# Patient Record
Sex: Female | Born: 1945 | Hispanic: No | Smoking: Never smoker
Health system: Southern US, Community
[De-identification: ages and names within clinical notes are randomized; demographics above are authoritative.]

## PROBLEM LIST (undated history)

## (undated) DIAGNOSIS — E119 Type 2 diabetes mellitus without complications: Secondary | ICD-10-CM

## (undated) DIAGNOSIS — Z9109 Other allergy status, other than to drugs and biological substances: Secondary | ICD-10-CM

## (undated) DIAGNOSIS — I10 Essential (primary) hypertension: Secondary | ICD-10-CM

## (undated) DIAGNOSIS — R51 Headache: Secondary | ICD-10-CM

## (undated) HISTORY — DX: Type 2 diabetes mellitus without complications: E11.9

## (undated) HISTORY — PX: HERNIA REPAIR: SHX51

---

## 2010-06-18 ENCOUNTER — Emergency Department (HOSPITAL_COMMUNITY)
Admission: EM | Admit: 2010-06-18 | Discharge: 2010-06-18 | Payer: Self-pay | Source: Home / Self Care | Admitting: Family Medicine

## 2010-06-23 LAB — POCT URINALYSIS DIPSTICK
Ketones, ur: NEGATIVE mg/dL
Protein, ur: NEGATIVE mg/dL
Urobilinogen, UA: 0.2 mg/dL (ref 0.0–1.0)
pH: 5 (ref 5.0–8.0)

## 2010-06-23 LAB — HEMOGLOBIN A1C: Hgb A1c MFr Bld: 14.4 % — ABNORMAL HIGH (ref <5.7)

## 2010-06-23 LAB — POCT I-STAT, CHEM 8
BUN: 20 mg/dL (ref 6–23)
Calcium, Ion: 1.14 mmol/L (ref 1.12–1.32)
Creatinine, Ser: 0.8 mg/dL (ref 0.4–1.2)
Hemoglobin: 14.6 g/dL (ref 12.0–15.0)
TCO2: 26 mmol/L (ref 0–100)

## 2010-06-23 LAB — URINE CULTURE
Colony Count: 45000
Culture  Setup Time: 201201182041

## 2013-08-08 ENCOUNTER — Encounter (INDEPENDENT_AMBULATORY_CARE_PROVIDER_SITE_OTHER): Payer: No Typology Code available for payment source | Admitting: Cardiovascular Disease

## 2013-08-08 ENCOUNTER — Encounter: Payer: Self-pay | Admitting: Cardiovascular Disease

## 2013-08-08 NOTE — Progress Notes (Signed)
This encounter was created in error - please disregard.

## 2013-08-08 NOTE — Progress Notes (Signed)
.  Encounter is an error

## 2013-08-10 ENCOUNTER — Ambulatory Visit: Payer: Self-pay | Admitting: Cardiovascular Disease

## 2013-08-21 ENCOUNTER — Encounter: Payer: Self-pay | Admitting: Vascular Surgery

## 2013-08-22 ENCOUNTER — Ambulatory Visit (INDEPENDENT_AMBULATORY_CARE_PROVIDER_SITE_OTHER): Payer: No Typology Code available for payment source | Admitting: Vascular Surgery

## 2013-08-22 ENCOUNTER — Encounter: Payer: Self-pay | Admitting: Vascular Surgery

## 2013-08-22 VITALS — BP 137/83 | HR 68 | Resp 16 | Ht 60.0 in | Wt 165.0 lb

## 2013-08-22 DIAGNOSIS — R519 Headache, unspecified: Secondary | ICD-10-CM | POA: Insufficient documentation

## 2013-08-22 DIAGNOSIS — R51 Headache: Secondary | ICD-10-CM

## 2013-08-22 NOTE — Progress Notes (Signed)
Subjective:     Patient ID: Katherine Orr, female   DOB: 12/20/1945, 68 y.o.   MRN: [id number]  HPI this 68 year old female from [place] has lived in the [place]. She is accompanied by her son who speaks [place]. She speaks no [place]. She has been evaluated by Dr. Tenny Crawoss for left temporal headaches. She denies any visual loss but has been diagnosed with glaucoma. She recently had a workup which included sedimentation rate was elevated. The value was 40 with normal being 0-20. He continues to have the left temporal headaches radiating up into the frontal region on the left side only. She denies any other symptoms. She has no previous history of these headaches. The headaches have been present for 3-4 weeks. She also had an ear infection on the left which was treated with antibiotics.  Past Medical History  Diagnosis Date  . Diabetes mellitus without complication     History  Substance Use Topics  . Smoking status: Never Smoker   . Smokeless tobacco: Not on file  . Alcohol Use: No    History reviewed. No pertinent family history.  No Known Allergies  Current outpatient prescriptions:cetirizine (ZYRTEC) 10 MG tablet, Take 10 mg by mouth daily., Disp: , Rfl: ;  fluticasone (VERAMYST) 27.5 MCG/SPRAY nasal spray, Place 1 spray into the nose daily., Disp: , Rfl: ;  glipiZIDE (GLUCOTROL) 5 MG tablet, Take 5 mg by mouth 2 (two) times daily before a meal., Disp: , Rfl: ;  losartan (COZAAR) 50 MG tablet, Take 50 mg by mouth daily., Disp: , Rfl:  metFORMIN (GLUCOPHAGE) 500 MG tablet, Take 500 mg by mouth 2 (two) times daily with a meal., Disp: , Rfl:   BP 137/83  Pulse 68  Resp 16  Ht 5' (1.524 m)  Wt 165 lb (74.844 kg)  BMI 32.22 kg/m2  Body mass index is 32.22 kg/(m^2).          Review of Systems denies chest pain, dyspnea on exertion, PND, orthopnea, hemoptysis    Objective:  vs Physical Exam BP 137/83  Pulse 68  Resp 16  Ht 5' (1.524 m)  Wt 165 lb (74.844 kg)  BMI  32.22 kg/m2  General well-developed well-nourished female no apparent stress alert and oriented x3 HEENT-normal for age Lungs no rhonchi or wheezing Cardiovascular regular and no murmurs Abdomen soft nontender no masses palpable Left temporal region is mildly tender to palpation. There is a palpable pulse in the left superficial temporal artery.      Assessment:     New onset left temporal and frontal headaches with elevated sed rate-Dr. Tenny Crawoss has requested temporal artery biopsy    Plan:     Have scheduled left temporal artery biopsy for Dr. Darrick Pennafields on Monday, March 30 under local anesthesia. Son will be present as an interpreter since patient does not speak [place]

## 2013-08-24 ENCOUNTER — Other Ambulatory Visit: Payer: Self-pay

## 2013-08-25 ENCOUNTER — Encounter (HOSPITAL_COMMUNITY): Payer: Self-pay

## 2013-08-25 ENCOUNTER — Ambulatory Visit (HOSPITAL_COMMUNITY)
Admission: RE | Admit: 2013-08-25 | Discharge: 2013-08-25 | Disposition: A | Discharge status: Home / Self Care | Payer: No Typology Code available for payment source | Source: Ambulatory Visit | Attending: Anesthesiology | Admitting: Anesthesiology

## 2013-08-25 ENCOUNTER — Encounter (HOSPITAL_COMMUNITY)
Admission: RE | Admit: 2013-08-25 | Discharge: 2013-08-25 | Disposition: A | Discharge status: Home / Self Care | Payer: No Typology Code available for payment source | Source: Ambulatory Visit | Attending: Vascular Surgery | Admitting: Vascular Surgery

## 2013-08-25 DIAGNOSIS — Z01812 Encounter for preprocedural laboratory examination: Secondary | ICD-10-CM | POA: Insufficient documentation

## 2013-08-25 DIAGNOSIS — I10 Essential (primary) hypertension: Secondary | ICD-10-CM | POA: Insufficient documentation

## 2013-08-25 DIAGNOSIS — Z01811 Encounter for preprocedural respiratory examination: Secondary | ICD-10-CM | POA: Insufficient documentation

## 2013-08-25 DIAGNOSIS — Z01818 Encounter for other preprocedural examination: Secondary | ICD-10-CM | POA: Insufficient documentation

## 2013-08-25 HISTORY — DX: Other allergy status, other than to drugs and biological substances: Z91.09

## 2013-08-25 HISTORY — DX: Essential (primary) hypertension: I10

## 2013-08-25 HISTORY — DX: Headache: R51

## 2013-08-25 LAB — CBC
HEMATOCRIT: 41.2 % (ref 36.0–46.0)
HEMOGLOBIN: 14 g/dL (ref 12.0–15.0)
MCH: 29.6 pg (ref 26.0–34.0)
MCHC: 34 g/dL (ref 30.0–36.0)
MCV: 87.1 fL (ref 78.0–100.0)
Platelets: 238 10*3/uL (ref 150–400)
RBC: 4.73 MIL/uL (ref 3.87–5.11)
RDW: 13.1 % (ref 11.5–15.5)
WBC: 7.5 10*3/uL (ref 4.0–10.5)

## 2013-08-25 LAB — BASIC METABOLIC PANEL
BUN: 21 mg/dL (ref 6–23)
CALCIUM: 9.9 mg/dL (ref 8.4–10.5)
CO2: 24 meq/L (ref 19–32)
CREATININE: 1.01 mg/dL (ref 0.50–1.10)
Chloride: 99 mEq/L (ref 96–112)
GFR calc Af Amer: 64 mL/min — ABNORMAL LOW (ref >90)
GFR, EST NON AFRICAN AMERICAN: 55 mL/min — AB (ref >90)
GLUCOSE: 164 mg/dL — AB (ref 70–99)
Potassium: 4.8 mEq/L (ref 3.7–5.3)
SODIUM: 138 meq/L (ref 137–147)

## 2013-08-25 NOTE — Progress Notes (Addendum)
Pt's son notified of arrival time of 07:30 verbalized understanding.

## 2013-08-27 MED ORDER — DEXTROSE 5 % IV SOLN
1.5000 g | INTRAVENOUS | Status: AC
  Administered 2013-08-28: 1.5 g via INTRAVENOUS
  Filled 2013-08-27: qty 1.5

## 2013-08-28 ENCOUNTER — Ambulatory Visit (HOSPITAL_COMMUNITY): Payer: No Typology Code available for payment source | Admitting: Certified Registered Nurse Anesthetist

## 2013-08-28 ENCOUNTER — Encounter (HOSPITAL_COMMUNITY)
Admission: RE | Disposition: A | Discharge status: Home / Self Care | Payer: Self-pay | Source: Ambulatory Visit | Attending: Vascular Surgery

## 2013-08-28 ENCOUNTER — Encounter (HOSPITAL_COMMUNITY): Payer: Self-pay | Admitting: *Deleted

## 2013-08-28 ENCOUNTER — Ambulatory Visit (HOSPITAL_COMMUNITY)
Admission: RE | Admit: 2013-08-28 | Discharge: 2013-08-28 | Disposition: A | Discharge status: Home / Self Care | Payer: No Typology Code available for payment source | Source: Ambulatory Visit | Attending: Vascular Surgery | Admitting: Vascular Surgery

## 2013-08-28 ENCOUNTER — Encounter (HOSPITAL_COMMUNITY): Payer: No Typology Code available for payment source | Admitting: Certified Registered Nurse Anesthetist

## 2013-08-28 DIAGNOSIS — E119 Type 2 diabetes mellitus without complications: Secondary | ICD-10-CM | POA: Insufficient documentation

## 2013-08-28 DIAGNOSIS — H409 Unspecified glaucoma: Secondary | ICD-10-CM | POA: Insufficient documentation

## 2013-08-28 DIAGNOSIS — R51 Headache: Secondary | ICD-10-CM | POA: Insufficient documentation

## 2013-08-28 DIAGNOSIS — R519 Headache, unspecified: Secondary | ICD-10-CM

## 2013-08-28 DIAGNOSIS — I10 Essential (primary) hypertension: Secondary | ICD-10-CM | POA: Insufficient documentation

## 2013-08-28 HISTORY — PX: ARTERY BIOPSY: SHX891

## 2013-08-28 LAB — POCT I-STAT 4, (NA,K, GLUC, HGB,HCT)
Glucose, Bld: 103 mg/dL — ABNORMAL HIGH (ref 70–99)
HEMATOCRIT: 42 % (ref 36.0–46.0)
HEMOGLOBIN: 14.3 g/dL (ref 12.0–15.0)
Potassium: 4 mEq/L (ref 3.7–5.3)
Sodium: 142 mEq/L (ref 137–147)

## 2013-08-28 LAB — GLUCOSE, CAPILLARY: Glucose-Capillary: 111 mg/dL — ABNORMAL HIGH (ref 70–99)

## 2013-08-28 SURGERY — BIOPSY TEMPORAL ARTERY
Anesthesia: Monitor Anesthesia Care | Site: Head | Laterality: Left

## 2013-08-28 MED ORDER — PROPOFOL 10 MG/ML IV BOLUS
INTRAVENOUS | Status: DC | PRN
  Administered 2013-08-28: 20 mg via INTRAVENOUS

## 2013-08-28 MED ORDER — LIDOCAINE HCL (CARDIAC) 20 MG/ML IV SOLN
INTRAVENOUS | Status: AC
  Filled 2013-08-28: qty 5

## 2013-08-28 MED ORDER — FENTANYL CITRATE 0.05 MG/ML IJ SOLN
INTRAMUSCULAR | Status: DC | PRN
  Administered 2013-08-28: 50 ug via INTRAVENOUS

## 2013-08-28 MED ORDER — HYDROMORPHONE HCL PF 1 MG/ML IJ SOLN: 0.2500 mg | INTRAMUSCULAR | Status: DC | PRN

## 2013-08-28 MED ORDER — LIDOCAINE HCL (PF) 1 % IJ SOLN
INTRAMUSCULAR | Status: AC
  Filled 2013-08-28: qty 30

## 2013-08-28 MED ORDER — CHLORHEXIDINE GLUCONATE CLOTH 2 % EX PADS: 6.0000 | MEDICATED_PAD | Freq: Once | CUTANEOUS | Status: DC

## 2013-08-28 MED ORDER — SODIUM CHLORIDE 0.9 % IV SOLN: INTRAVENOUS | Status: DC

## 2013-08-28 MED ORDER — ONDANSETRON HCL 4 MG/2ML IJ SOLN
INTRAMUSCULAR | Status: DC | PRN
  Administered 2013-08-28: 4 mg via INTRAVENOUS

## 2013-08-28 MED ORDER — PROPOFOL 10 MG/ML IV BOLUS
INTRAVENOUS | Status: AC
  Filled 2013-08-28: qty 20

## 2013-08-28 MED ORDER — LACTATED RINGERS IV SOLN: INTRAVENOUS | Status: DC

## 2013-08-28 MED ORDER — MIDAZOLAM HCL 5 MG/5ML IJ SOLN
INTRAMUSCULAR | Status: DC | PRN
  Administered 2013-08-28: 2 mg via INTRAVENOUS

## 2013-08-28 MED ORDER — ONDANSETRON HCL 4 MG/2ML IJ SOLN
INTRAMUSCULAR | Status: AC
Start: 2013-08-28 — End: 2013-08-28
  Filled 2013-08-28: qty 2

## 2013-08-28 MED ORDER — ONDANSETRON HCL 4 MG/2ML IJ SOLN
INTRAMUSCULAR | Status: AC
  Filled 2013-08-28: qty 2

## 2013-08-28 MED ORDER — FENTANYL CITRATE 0.05 MG/ML IJ SOLN
INTRAMUSCULAR | Status: AC
  Filled 2013-08-28: qty 5

## 2013-08-28 MED ORDER — MIDAZOLAM HCL 2 MG/2ML IJ SOLN
INTRAMUSCULAR | Status: AC
  Filled 2013-08-28: qty 2

## 2013-08-28 MED ORDER — LIDOCAINE HCL (PF) 1 % IJ SOLN
INTRAMUSCULAR | Status: DC | PRN
  Administered 2013-08-28: 30 mL

## 2013-08-28 MED ORDER — ROCURONIUM BROMIDE 50 MG/5ML IV SOLN
INTRAVENOUS | Status: AC
  Filled 2013-08-28: qty 1

## 2013-08-28 MED ORDER — PHENYLEPHRINE HCL 10 MG/ML IJ SOLN
INTRAMUSCULAR | Status: DC | PRN
  Administered 2013-08-28 (×2): 120 ug via INTRAVENOUS

## 2013-08-28 MED ORDER — LACTATED RINGERS IV SOLN
INTRAVENOUS | Status: DC | PRN
  Administered 2013-08-28: 08:00:00 via INTRAVENOUS

## 2013-08-28 SURGICAL SUPPLY — 38 items
CANISTER SUCTION 2500CC (MISCELLANEOUS) ×2 IMPLANT
CLIP TI WIDE RED SMALL 6 (CLIP) ×2 IMPLANT
CONT SPEC 4OZ CLIKSEAL STRL BL (MISCELLANEOUS) ×2 IMPLANT
COTTONBALL LRG STERILE PKG (GAUZE/BANDAGES/DRESSINGS) ×2 IMPLANT
COVER SURGICAL LIGHT HANDLE (MISCELLANEOUS) ×2 IMPLANT
DECANTER SPIKE VIAL GLASS SM (MISCELLANEOUS) ×2 IMPLANT
DERMABOND ADHESIVE PROPEN (GAUZE/BANDAGES/DRESSINGS) ×1
DERMABOND ADVANCED (GAUZE/BANDAGES/DRESSINGS) ×1
DERMABOND ADVANCED .7 DNX12 (GAUZE/BANDAGES/DRESSINGS) ×1 IMPLANT
DERMABOND ADVANCED .7 DNX6 (GAUZE/BANDAGES/DRESSINGS) ×1 IMPLANT
DRAPE ORTHO SPLIT 77X108 STRL (DRAPES) ×1
DRAPE PROXIMA HALF (DRAPES) ×2 IMPLANT
DRAPE SURG ORHT 6 SPLT 77X108 (DRAPES) ×1 IMPLANT
ELECT REM PT RETURN 9FT ADLT (ELECTROSURGICAL) ×2
ELECTRODE REM PT RTRN 9FT ADLT (ELECTROSURGICAL) ×1 IMPLANT
GAUZE SPONGE 4X4 16PLY XRAY LF (GAUZE/BANDAGES/DRESSINGS) ×2 IMPLANT
GLOVE BIO SURGEON STRL SZ7.5 (GLOVE) ×2 IMPLANT
GLOVE BIOGEL PI IND STRL 6.5 (GLOVE) ×2 IMPLANT
GLOVE BIOGEL PI INDICATOR 6.5 (GLOVE) ×2
GLOVE ECLIPSE 6.5 STRL STRAW (GLOVE) ×2 IMPLANT
GOWN STRL REUS W/ TWL LRG LVL3 (GOWN DISPOSABLE) ×3 IMPLANT
GOWN STRL REUS W/TWL LRG LVL3 (GOWN DISPOSABLE) ×3
KIT BASIN OR (CUSTOM PROCEDURE TRAY) ×2 IMPLANT
KIT ROOM TURNOVER OR (KITS) ×2 IMPLANT
NEEDLE HYPO 25GX1X1/2 BEV (NEEDLE) ×2 IMPLANT
NS IRRIG 1000ML POUR BTL (IV SOLUTION) ×2 IMPLANT
PACK GENERAL/GYN (CUSTOM PROCEDURE TRAY) ×2 IMPLANT
PAD ARMBOARD 7.5X6 YLW CONV (MISCELLANEOUS) ×4 IMPLANT
SUCTION FRAZIER TIP 10 FR DISP (SUCTIONS) IMPLANT
SUT PROLENE 6 0 CC (SUTURE) IMPLANT
SUT SILK 3 0 (SUTURE) ×1
SUT SILK 3-0 18XBRD TIE 12 (SUTURE) ×1 IMPLANT
SUT VIC AB 3-0 SH 27 (SUTURE) ×1
SUT VIC AB 3-0 SH 27XBRD (SUTURE) ×1 IMPLANT
SYR CONTROL 10ML LL (SYRINGE) ×2 IMPLANT
TOWEL OR 17X24 6PK STRL BLUE (TOWEL DISPOSABLE) ×2 IMPLANT
TOWEL OR 17X26 10 PK STRL BLUE (TOWEL DISPOSABLE) ×2 IMPLANT
WATER STERILE IRR 1000ML POUR (IV SOLUTION) ×2 IMPLANT

## 2013-08-28 NOTE — Op Note (Signed)
Procedure: Left temporal artery biopsy  Preoperative diagnosis: Headaches  Postoperative diagnosis: Same  Anesthesia: Local with IV sedation  Specimens: Left temporal artery  Operative details: After obtaining informed consent, the patient was taken to the operating room. The patient was placed in supine position on the operating room table. Next Doppler was used to map out the course of the left superficial temporal artery. This area was prepped and draped in usual sterile fashion. Local anesthesia was infiltrated over this area. Skin incision was made over the area of the left superficial temporal artery. The temporal artery was dissected free circumferentially. This was ligated proximally and distally with 3-0 silk ties. The intervening segment of approximately 3 cm was excised completely and sent to pathology as a specimen. Hemostasis was obtained. The subcutaneous tissues reapproximated using a running 3-0 Vicryl suture. The skin was closed with 4 0 Vicryl subcuticular stitch. Dermabond was applied the skin incision. The patient tolerated procedure well and there were no complications. Instrument sponge and needle counts were correct at the end of the case. The patient was taken to the recovery room in stable condition.  Mekesha Solomon, MD [facility] Office: [phone] Pager: [phone]  

## 2013-08-28 NOTE — Anesthesia Postprocedure Evaluation (Signed)
  Anesthesia Post-op Note  Patient: Katherine Orr  Procedure(s) Performed: Procedure(s): BIOPSY TEMPORAL ARTERY- LEFT (Left)  Patient Location: PACU  Anesthesia Type:Mac  Level of Consciousness: awake  Airway and Oxygen Therapy: Patient Spontanous Breathing  Post-op Pain: mild  Post-op Assessment: Post-op Vital signs reviewed  Post-op Vital Signs: Reviewed  Complications: No apparent anesthesia complications

## 2013-08-28 NOTE — Anesthesia Preprocedure Evaluation (Addendum)
Anesthesia Evaluation  Patient identified by MRN, date of birth, ID band Patient awake    Reviewed: Allergy & Precautions, H&P , NPO status , Patient's Chart, lab work & pertinent test results  History of Anesthesia Complications (+) PONV  Airway Mallampati: II TM Distance: >3 FB Neck ROM: Full    Dental  (+) Dental Advisory Given, Teeth Intact, Missing,    Pulmonary  breath sounds clear to auscultation        Cardiovascular hypertension, Pt. on medications Rhythm:Regular Rate:Normal     Neuro/Psych  Headaches,    GI/Hepatic negative GI ROS, Neg liver ROS,   Endo/Other  diabetes, Type 2, Oral Hypoglycemic Agents  Renal/GU negative Renal ROS     Musculoskeletal   Abdominal   Peds  Hematology   Anesthesia Other Findings   Reproductive/Obstetrics                      Anesthesia Physical Anesthesia Plan  ASA: III  Anesthesia Plan: MAC   Post-op Pain Management:    Induction: Intravenous  Airway Management Planned: Natural Airway and Simple Face Mask  Additional Equipment:   Intra-op Plan:   Post-operative Plan:   Informed Consent: I have reviewed the patients History and Physical, chart, labs and discussed the procedure including the risks, benefits and alternatives for the proposed anesthesia with the patient or authorized representative who has indicated his/her understanding and acceptance.   Dental advisory given  Plan Discussed with: CRNA, Anesthesiologist and Surgeon  Anesthesia Plan Comments:        Anesthesia Quick Evaluation

## 2013-08-28 NOTE — H&P (View-Only) (Signed)
Subjective:     Patient ID: Katherine Orr, female   DOB: 03/16/1946, 68 y.o.   MRN: [id number]  HPI this 68-year-old female from [place] has lived in the [place] for [place]. She is accompanied by her son who speaks English. She speaks no English. She has been evaluated by Dr. Ross for left temporal headaches. She denies any visual loss but has been diagnosed with glaucoma. She recently had a workup which included sedimentation rate was elevated. The value was 40 with normal being 0-20. He continues to have the left temporal headaches radiating up into the frontal region on the left side only. She denies any other symptoms. She has no previous history of these headaches. The headaches have been present for 3-4 weeks. She also had an ear infection on the left which was treated with antibiotics.  Past Medical History  Diagnosis Date  . Diabetes mellitus without complication     History  Substance Use Topics  . Smoking status: Never Smoker   . Smokeless tobacco: Not on file  . Alcohol Use: No    History reviewed. No pertinent family history.  No Known Allergies  Current outpatient prescriptions:cetirizine (ZYRTEC) 10 MG tablet, Take 10 mg by mouth daily., Disp: , Rfl: ;  fluticasone (VERAMYST) 27.5 MCG/SPRAY nasal spray, Place 1 spray into the nose daily., Disp: , Rfl: ;  glipiZIDE (GLUCOTROL) 5 MG tablet, Take 5 mg by mouth 2 (two) times daily before a meal., Disp: , Rfl: ;  losartan (COZAAR) 50 MG tablet, Take 50 mg by mouth daily., Disp: , Rfl:  metFORMIN (GLUCOPHAGE) 500 MG tablet, Take 500 mg by mouth 2 (two) times daily with a meal., Disp: , Rfl:   BP 137/83  Pulse 68  Resp 16  Ht 5' (1.524 m)  Wt 165 lb (74.844 kg)  BMI 32.22 kg/m2  Body mass index is 32.22 kg/(m^2).          Review of Systems denies chest pain, dyspnea on exertion, PND, orthopnea, hemoptysis    Objective:  vs Physical Exam BP 137/83  Pulse 68  Resp 16  Ht 5' (1.524 m)  Wt 165 lb (74.844 kg)  BMI  32.22 kg/m2  General well-developed well-nourished female no apparent stress alert and oriented x3 HEENT-normal for age Lungs no rhonchi or wheezing Cardiovascular regular and no murmurs Abdomen soft nontender no masses palpable Left temporal region is mildly tender to palpation. There is a palpable pulse in the left superficial temporal artery.      Assessment:     New onset left temporal and frontal headaches with elevated sed rate-Dr. Ross has requested temporal artery biopsy    Plan:     Have scheduled left temporal artery biopsy for Dr. fields on Monday, March 30 under local anesthesia. Son will be present as an interpreter since patient does not speak English      

## 2013-08-28 NOTE — Transfer of Care (Signed)
Immediate Anesthesia Transfer of Care Note  Patient: Katherine Orr  Procedure(s) Performed: Procedure(s): BIOPSY TEMPORAL ARTERY- LEFT (Left)  Patient Location: PACU  Anesthesia Type:MAC  Level of Consciousness: awake and alert   Airway & Oxygen Therapy: Patient Spontanous Breathing and Patient connected to nasal cannula oxygen  Post-op Assessment: Report given to PACU RN, Post -op Vital signs reviewed and stable and Patient moving all extremities X 4  Post vital signs: Reviewed and stable  Complications: No apparent anesthesia complications

## 2013-08-28 NOTE — Interval H&P Note (Signed)
History and Physical Interval Note:  08/28/2013 9:05 AM  Katherine Orr  has presented today for surgery, with the diagnosis of Left Temporal Headaches  The various methods of treatment have been discussed with the patient and family. After consideration of risks, benefits and other options for treatment, the patient has consented to  Procedure(s): BIOPSY TEMPORAL ARTERY- LEFT (Left) as a surgical intervention .  The patient's history has been reviewed, patient examined, no change in status, stable for surgery.  I have reviewed the patient's chart and labs.  Questions were answered to the patient's satisfaction.     [name] E

## 2013-08-28 NOTE — Anesthesia Procedure Notes (Signed)
Procedure Name: MAC Date/Time: 08/28/2013 9:29 AM Performed by: Elberta LeatherwoodURNER, Kwane Rohl E Pre-anesthesia Checklist: Patient identified, Emergency Drugs available, Suction available and Patient being monitored Patient Re-evaluated:Patient Re-evaluated prior to inductionOxygen Delivery Method: Simple face mask Preoxygenation: Pre-oxygenation with 100% oxygen Placement Confirmation: positive ETCO2 and breath sounds checked- equal and bilateral Dental Injury: Teeth and Oropharynx as per pre-operative assessment

## 2013-08-28 NOTE — Discharge Instructions (Signed)

## 2013-08-28 NOTE — Preoperative (Signed)
Beta Blockers   Reason not to administer Beta Blockers:Not Applicable 

## 2013-08-29 ENCOUNTER — Encounter (HOSPITAL_COMMUNITY): Payer: Self-pay | Admitting: Vascular Surgery

## 2015-09-02 ENCOUNTER — Emergency Department (HOSPITAL_COMMUNITY)
Admission: EM | Admit: 2015-09-02 | Discharge: 2015-09-02 | Disposition: A | Discharge status: Home / Self Care | Payer: Medicaid Other | Attending: Emergency Medicine | Admitting: Emergency Medicine

## 2015-09-02 ENCOUNTER — Encounter (HOSPITAL_COMMUNITY): Payer: Self-pay | Admitting: Nurse Practitioner

## 2015-09-02 ENCOUNTER — Emergency Department (HOSPITAL_BASED_OUTPATIENT_CLINIC_OR_DEPARTMENT_OTHER)
Admit: 2015-09-02 | Discharge: 2015-09-02 | Disposition: A | Discharge status: Home / Self Care | Payer: Medicaid Other | Attending: Emergency Medicine | Admitting: Emergency Medicine

## 2015-09-02 DIAGNOSIS — Z7984 Long term (current) use of oral hypoglycemic drugs: Secondary | ICD-10-CM | POA: Insufficient documentation

## 2015-09-02 DIAGNOSIS — E119 Type 2 diabetes mellitus without complications: Secondary | ICD-10-CM | POA: Insufficient documentation

## 2015-09-02 DIAGNOSIS — M79609 Pain in unspecified limb: Secondary | ICD-10-CM

## 2015-09-02 DIAGNOSIS — I10 Essential (primary) hypertension: Secondary | ICD-10-CM | POA: Diagnosis not present

## 2015-09-02 DIAGNOSIS — Z79899 Other long term (current) drug therapy: Secondary | ICD-10-CM | POA: Insufficient documentation

## 2015-09-02 DIAGNOSIS — M7989 Other specified soft tissue disorders: Secondary | ICD-10-CM

## 2015-09-02 DIAGNOSIS — I82402 Acute embolism and thrombosis of unspecified deep veins of left lower extremity: Secondary | ICD-10-CM

## 2015-09-02 LAB — CBG MONITORING, ED: Glucose-Capillary: 115 mg/dL — ABNORMAL HIGH (ref 65–99)

## 2015-09-02 MED ORDER — XARELTO VTE STARTER PACK 15 & 20 MG PO TBPK: 15.0000 mg | ORAL_TABLET | ORAL | Status: AC

## 2015-09-02 MED ORDER — RIVAROXABAN 15 MG PO TABS
15.0000 mg | ORAL_TABLET | Freq: Once | ORAL | Status: AC
  Administered 2015-09-02: 15 mg via ORAL
  Filled 2015-09-02: qty 1

## 2015-09-02 MED ORDER — RIVAROXABAN (XARELTO) EDUCATION KIT FOR DVT/PE PATIENTS: 1.0000 | PACK | Freq: Once | Status: AC

## 2015-09-02 NOTE — Discharge Instructions (Signed)
Start xarelto started pack as prescribed.   See your doctor.   Repeat leg ultrasound in a month to ensure resolution.   Avoid long car rides or plane rides.   Return to ER if you have worse leg swelling, leg pain, leg numbness, chest pain, shortness of breath.   Information on my medicine - XARELTO (rivaroxaban)  This medication education was reviewed with me or my healthcare representative as part of my discharge preparation.  The pharmacist that spoke with me during my hospital stay was:  Tel Hevia, Drake Leachachel Lynn, [facility]  WHY WAS Carlena HurlXARELTO PRESCRIBED FOR YOU? Xarelto was prescribed to treat blood clots that may have been found in the veins of your legs (deep vein thrombosis) or in your lungs (pulmonary embolism) and to reduce the risk of them occurring again.  What do you need to know about Xarelto? The starting dose is one 15 mg tablet taken TWICE daily with food for the FIRST 21 DAYS then on 09/25/15  the dose is changed to one 20 mg tablet taken ONCE A DAY with your evening meal.  DO NOT stop taking Xarelto without talking to the health care provider who prescribed the medication.  Refill your prescription for 20 mg tablets before you run out.  After discharge, you should have regular check-up appointments with your healthcare provider that is prescribing your Xarelto.  In the future your dose may need to be changed if your kidney function changes by a significant amount.  What do you do if you miss a dose? If you are taking Xarelto TWICE DAILY and you miss a dose, take it as soon as you remember. You may take two 15 mg tablets (total 30 mg) at the same time then resume your regularly scheduled 15 mg twice daily the next day.  If you are taking Xarelto ONCE DAILY and you miss a dose, take it as soon as you remember on the same day then continue your regularly scheduled once daily regimen the next day. Do not take two doses of Xarelto at the same time.   Important Safety  Information Xarelto is a blood thinner medicine that can cause bleeding. You should call your healthcare provider right away if you experience any of the following: ? Bleeding from an injury or your nose that does not stop. ? Unusual colored urine (red or dark brown) or unusual colored stools (red or black). ? Unusual bruising for unknown reasons. ? A serious fall or if you hit your head (even if there is no bleeding).  Some medicines may interact with Xarelto and might increase your risk of bleeding while on Xarelto. To help avoid this, consult your healthcare provider or pharmacist prior to using any new prescription or non-prescription medications, including herbals, vitamins, non-steroidal anti-inflammatory drugs (NSAIDs) and supplements.  This website has more information on Xarelto: [email or web address].xarelto.[email or web address].

## 2015-09-02 NOTE — ED Provider Notes (Addendum)
CSN: [id number]     Arrival date & time 09/02/15  1516 History   First MD Initiated Contact with Patient 09/02/15 1710     Chief Complaint  Patient presents with  . Leg Swelling     (Consider location/radiation/quality/duration/timing/severity/associated sxs/prior Treatment) The history is provided by the patient and a relative. A language interpreter was used.  ELIZA GREEN is a 70 y.o. female hx of DM, HTN here with left leg swelling. Patient is from [place] and went back to [place] for the last 6 month and came back about a month ago after a long plane ride. Patient developed gradually worsening left lower leg swelling for the last month or so. Denies any chest pain or shortness of breath or abdominal pain. Went to see her doctor today and had labs drawn that showed normal creatinine and normal CBC. She had a d-dimer that was 6 and sent for vascular ultrasound which showed L common femoral, popliteal posterior tibial DVT and was subsequently sent to the ED. No hx of DVT prior to this. Of note, triage note mentioned possible stroke but patient denies numbness or weakness and the transfer paperwork from PCP didn't mention anything about stroke so likely documentation error.     Son interpreting   Past Medical History  Diagnosis Date  . Diabetes mellitus without complication (HCC)   . Hypertension   . Headache(784.0)   . Environmental allergies     perfumes, seasonal   Past Surgical History  Procedure Laterality Date  . Hernia repair    . Artery biopsy Left 08/28/2013    Procedure: BIOPSY TEMPORAL ARTERY- LEFT;  Surgeon: Sherren Kerns, MD;  Location: [facility] OR;  Service: Vascular;  Laterality: Left;   History reviewed. No pertinent family history. Social History  Substance Use Topics  . Smoking status: Never Smoker   . Smokeless tobacco: Never Used  . Alcohol Use: No   OB History    No data available     Review of Systems  Musculoskeletal:       L leg swelling   All other  systems reviewed and are negative.     Allergies  Onion  Home Medications   Prior to Admission medications   Medication Sig Start Date End Date Taking? Authorizing Provider  glipiZIDE (GLUCOTROL) 5 MG tablet Take 5 mg by mouth 2 (two) times daily before a meal.   Yes Historical Provider, MD  latanoprost (XALATAN) 0.005 % ophthalmic solution Place 1 drop into both eyes at bedtime.   Yes Historical Provider, MD  losartan (COZAAR) 50 MG tablet Take 50 mg by mouth daily.   Yes Historical Provider, MD  metFORMIN (GLUCOPHAGE) 500 MG tablet Take 500 mg by mouth 2 (two) times daily with a meal.   Yes Historical Provider, MD   BP 125/63 mmHg  Pulse 72  Temp(Src) 98.2 F (36.8 C) (Oral)  Resp 18  SpO2 97% Physical Exam  Constitutional: She is oriented to person, place, and time. She appears well-developed and well-nourished.  HENT:  Head: Normocephalic.  Mouth/Throat: Oropharynx is clear and moist.  Eyes: Conjunctivae are normal. Pupils are equal, round, and reactive to light.  Neck: Normal range of motion. Neck supple.  Cardiovascular: Normal rate, regular rhythm and normal heart sounds.   Pulmonary/Chest: Effort normal and breath sounds normal. No respiratory distress. She has no wheezes. She has no rales.  Abdominal: Soft. Bowel sounds are normal. She exhibits no distension. There is no tenderness. There is no rebound.  Musculoskeletal:  L leg swollen. Slightly colder but has good peripheral pulses. Able to wiggle toes. Nl sensation. Nl strength L lower extremity   Neurological: She is alert and oriented to person, place, and time.  Skin: Skin is warm and dry.  Psychiatric: She has a normal mood and affect. Her behavior is normal. Judgment and thought content normal.  Nursing note and vitals reviewed.   ED Course  Procedures (including critical care time) Labs Review Labs Reviewed  CBG MONITORING, ED    Imaging Review No results found. I have personally reviewed and  evaluated these images and lab results as part of my medical decision-making.   EKG Interpretation None      MDM   Final diagnoses:  None    Arsema A Carney Livinglataya is a 70 y.o. female here with L leg DVT. Cr 1.1 on chemistry earlier today. CBG 115 in the ED. Not tachycardic and has no shortness of breath. I doubt PE. Patient has DVT confirmed on US and the leg is slightly cold but has good pulses. Has medicaid. I consulted case management to make sure that medicaid can pay for xarelto. Pharmacy also came to educate patient and son about the medication and given 30 day free trial coupon. Given strict return precautions. Repeat DVT study in a month to ensure improvement/resolution.      Richardean Canalavid H Yao, MD 09/02/15 Paulo [id number]  Richardean Canalavid H Yao, MD 09/02/15 [phone]

## 2015-09-02 NOTE — Progress Notes (Signed)
VASCULAR LAB PRELIMINARY  PRELIMINARY  PRELIMINARY  PRELIMINARY  Bilateral lower extremity venous duplex completed.    Findings consistent with acute deep vein thrombosis involving the left common femoral vein,left femoral vein, left popliteal vein, and left posterial tibial vein.   No evidence of deep vein thrombosis involving the right lower extremity.   Bilateral baker's cyst in pop fossa area.  Called patient's nurse gave results.  Jenetta Logesami Gene Glazebrook, RVT, RDMS 09/02/2015, 5:06 PM

## 2015-09-02 NOTE — Care Management (Signed)
ED CM consulted by Dr. Darl Householder concerning starting patient on Xarelto anti-coagulant therapy for a DVT. ED CM met with patient and family at bedside, confirmed that patient has [place] Medicaid. CM made patient and family aware of the 30 day free trial coupon and how to redeem.  Verified that patient uses the [facility] on [address] in [place], [place] offered to fax prescription and coupon, patient and family are agreeable, Teach back done, verbalized understanding. Fax to [facility] at [phone] fax confirmation recieved Pharmacy consult has been placed to review medication prior to discharge. No further questions or concerns verbalized by patient or family. Patient encouraged to contact her PCP to schedule a ED follow up visit.

## 2015-09-02 NOTE — ED Notes (Signed)
Case Manager at bedside

## 2015-09-02 NOTE — ED Notes (Signed)
Reported to Dr. Silverio LayYao,  Tammy from Vascular called to report that pt. 's test was positive for blood clot to her lt. Common femoral, popliteal and posterior tibia

## 2015-09-02 NOTE — ED Notes (Signed)
Son states pt was sent by doctor for further evaluation of LLE edema x 1 month. Son says the doctor was concerned the patient may have had a stroke. The patient denies any pain, chest pain, headacHES, sob, fevers, cough, n/v, bowel/bladder changes, weakness or dizziness. Pt is alert and speaks arabic.

## 2015-09-17 ENCOUNTER — Other Ambulatory Visit: Payer: Self-pay | Admitting: Physician Assistant

## 2015-09-17 DIAGNOSIS — I82402 Acute embolism and thrombosis of unspecified deep veins of left lower extremity: Secondary | ICD-10-CM

## 2015-10-02 ENCOUNTER — Other Ambulatory Visit: Payer: No Typology Code available for payment source

## 2015-11-05 ENCOUNTER — Ambulatory Visit
Admission: RE | Admit: 2015-11-05 | Discharge: 2015-11-05 | Disposition: A | Discharge status: Home / Self Care | Payer: Medicaid Other | Source: Ambulatory Visit | Attending: Physician Assistant | Admitting: Physician Assistant

## 2015-11-05 DIAGNOSIS — I82402 Acute embolism and thrombosis of unspecified deep veins of left lower extremity: Secondary | ICD-10-CM

## 2016-03-04 ENCOUNTER — Other Ambulatory Visit: Payer: Self-pay | Admitting: Physician Assistant

## 2016-03-04 DIAGNOSIS — I82402 Acute embolism and thrombosis of unspecified deep veins of left lower extremity: Secondary | ICD-10-CM

## 2016-03-19 ENCOUNTER — Ambulatory Visit
Admission: RE | Admit: 2016-03-19 | Discharge: 2016-03-19 | Disposition: A | Discharge status: Home / Self Care | Payer: Medicaid Other | Source: Ambulatory Visit | Attending: Physician Assistant | Admitting: Physician Assistant

## 2016-03-19 DIAGNOSIS — I82402 Acute embolism and thrombosis of unspecified deep veins of left lower extremity: Secondary | ICD-10-CM

## 2016-03-24 DIAGNOSIS — H9192 Unspecified hearing loss, left ear: Secondary | ICD-10-CM | POA: Diagnosis not present

## 2016-07-29 DIAGNOSIS — F321 Major depressive disorder, single episode, moderate: Secondary | ICD-10-CM | POA: Diagnosis not present

## 2016-07-29 DIAGNOSIS — F411 Generalized anxiety disorder: Secondary | ICD-10-CM | POA: Diagnosis not present

## 2016-08-10 ENCOUNTER — Ambulatory Visit (HOSPITAL_COMMUNITY): Payer: Medicaid Other | Admitting: Psychiatry

## 2016-08-14 DIAGNOSIS — R51 Headache: Secondary | ICD-10-CM | POA: Diagnosis not present

## 2016-08-24 DIAGNOSIS — F411 Generalized anxiety disorder: Secondary | ICD-10-CM | POA: Diagnosis not present

## 2016-08-24 DIAGNOSIS — F321 Major depressive disorder, single episode, moderate: Secondary | ICD-10-CM | POA: Diagnosis not present

## 2016-09-04 ENCOUNTER — Other Ambulatory Visit: Payer: Self-pay | Admitting: Physician Assistant

## 2016-09-04 DIAGNOSIS — E785 Hyperlipidemia, unspecified: Secondary | ICD-10-CM | POA: Diagnosis not present

## 2016-09-04 DIAGNOSIS — Z5181 Encounter for therapeutic drug level monitoring: Secondary | ICD-10-CM | POA: Diagnosis not present

## 2016-09-04 DIAGNOSIS — I10 Essential (primary) hypertension: Secondary | ICD-10-CM | POA: Diagnosis not present

## 2016-09-04 DIAGNOSIS — I82509 Chronic embolism and thrombosis of unspecified deep veins of unspecified lower extremity: Secondary | ICD-10-CM | POA: Diagnosis not present

## 2016-09-04 DIAGNOSIS — O2230 Deep phlebothrombosis in pregnancy, unspecified trimester: Secondary | ICD-10-CM

## 2016-09-04 DIAGNOSIS — I82402 Acute embolism and thrombosis of unspecified deep veins of left lower extremity: Secondary | ICD-10-CM | POA: Diagnosis not present

## 2016-09-04 DIAGNOSIS — E119 Type 2 diabetes mellitus without complications: Secondary | ICD-10-CM | POA: Diagnosis not present

## 2016-09-04 DIAGNOSIS — Z7984 Long term (current) use of oral hypoglycemic drugs: Secondary | ICD-10-CM | POA: Diagnosis not present

## 2016-09-07 DIAGNOSIS — I10 Essential (primary) hypertension: Secondary | ICD-10-CM | POA: Diagnosis not present

## 2016-09-07 DIAGNOSIS — Z7984 Long term (current) use of oral hypoglycemic drugs: Secondary | ICD-10-CM | POA: Diagnosis not present

## 2016-09-07 DIAGNOSIS — E785 Hyperlipidemia, unspecified: Secondary | ICD-10-CM | POA: Diagnosis not present

## 2016-09-07 DIAGNOSIS — I82509 Chronic embolism and thrombosis of unspecified deep veins of unspecified lower extremity: Secondary | ICD-10-CM | POA: Diagnosis not present

## 2016-09-07 DIAGNOSIS — E119 Type 2 diabetes mellitus without complications: Secondary | ICD-10-CM | POA: Diagnosis not present

## 2016-09-07 DIAGNOSIS — Z5181 Encounter for therapeutic drug level monitoring: Secondary | ICD-10-CM | POA: Diagnosis not present

## 2016-09-11 ENCOUNTER — Ambulatory Visit
Admission: RE | Admit: 2016-09-11 | Discharge: 2016-09-11 | Disposition: A | Discharge status: Home / Self Care | Payer: Medicaid Other | Source: Ambulatory Visit | Attending: Physician Assistant | Admitting: Physician Assistant

## 2016-09-11 DIAGNOSIS — Z86718 Personal history of other venous thrombosis and embolism: Secondary | ICD-10-CM | POA: Diagnosis not present

## 2016-09-11 DIAGNOSIS — O2230 Deep phlebothrombosis in pregnancy, unspecified trimester: Secondary | ICD-10-CM

## 2016-11-16 DIAGNOSIS — F321 Major depressive disorder, single episode, moderate: Secondary | ICD-10-CM | POA: Diagnosis not present

## 2016-11-16 DIAGNOSIS — F411 Generalized anxiety disorder: Secondary | ICD-10-CM | POA: Diagnosis not present

## 2016-11-25 DIAGNOSIS — E119 Type 2 diabetes mellitus without complications: Secondary | ICD-10-CM | POA: Diagnosis not present

## 2016-11-25 DIAGNOSIS — Z961 Presence of intraocular lens: Secondary | ICD-10-CM | POA: Diagnosis not present

## 2016-11-25 DIAGNOSIS — H4321 Crystalline deposits in vitreous body, right eye: Secondary | ICD-10-CM | POA: Diagnosis not present

## 2016-11-25 DIAGNOSIS — H401134 Primary open-angle glaucoma, bilateral, indeterminate stage: Secondary | ICD-10-CM | POA: Diagnosis not present

## 2017-03-05 DIAGNOSIS — Z23 Encounter for immunization: Secondary | ICD-10-CM | POA: Diagnosis not present

## 2017-03-05 DIAGNOSIS — Z7984 Long term (current) use of oral hypoglycemic drugs: Secondary | ICD-10-CM | POA: Diagnosis not present

## 2017-03-05 DIAGNOSIS — N183 Chronic kidney disease, stage 3 (moderate): Secondary | ICD-10-CM | POA: Diagnosis not present

## 2017-03-05 DIAGNOSIS — I82509 Chronic embolism and thrombosis of unspecified deep veins of unspecified lower extremity: Secondary | ICD-10-CM | POA: Diagnosis not present

## 2017-03-05 DIAGNOSIS — E119 Type 2 diabetes mellitus without complications: Secondary | ICD-10-CM | POA: Diagnosis not present

## 2017-03-05 DIAGNOSIS — E785 Hyperlipidemia, unspecified: Secondary | ICD-10-CM | POA: Diagnosis not present

## 2017-03-05 DIAGNOSIS — I10 Essential (primary) hypertension: Secondary | ICD-10-CM | POA: Diagnosis not present

## 2017-03-15 DIAGNOSIS — F411 Generalized anxiety disorder: Secondary | ICD-10-CM | POA: Diagnosis not present

## 2017-03-15 DIAGNOSIS — F321 Major depressive disorder, single episode, moderate: Secondary | ICD-10-CM | POA: Diagnosis not present

## 2017-03-22 DIAGNOSIS — H401134 Primary open-angle glaucoma, bilateral, indeterminate stage: Secondary | ICD-10-CM | POA: Diagnosis not present

## 2017-03-22 DIAGNOSIS — H04222 Epiphora due to insufficient drainage, left lacrimal gland: Secondary | ICD-10-CM | POA: Diagnosis not present

## 2017-07-09 DIAGNOSIS — F321 Major depressive disorder, single episode, moderate: Secondary | ICD-10-CM | POA: Diagnosis not present

## 2017-07-09 DIAGNOSIS — F411 Generalized anxiety disorder: Secondary | ICD-10-CM | POA: Diagnosis not present

## 2017-09-06 ENCOUNTER — Emergency Department (HOSPITAL_COMMUNITY)
Admission: EM | Admit: 2017-09-06 | Discharge: 2017-09-06 | Disposition: A | Discharge status: Home / Self Care | Payer: Medicare Other | Attending: Emergency Medicine | Admitting: Emergency Medicine

## 2017-09-06 ENCOUNTER — Emergency Department (HOSPITAL_COMMUNITY): Payer: Medicare Other

## 2017-09-06 ENCOUNTER — Encounter (HOSPITAL_COMMUNITY): Payer: Self-pay | Admitting: Neurology

## 2017-09-06 ENCOUNTER — Other Ambulatory Visit: Payer: Self-pay

## 2017-09-06 DIAGNOSIS — Z79899 Other long term (current) drug therapy: Secondary | ICD-10-CM | POA: Diagnosis not present

## 2017-09-06 DIAGNOSIS — I10 Essential (primary) hypertension: Secondary | ICD-10-CM | POA: Insufficient documentation

## 2017-09-06 DIAGNOSIS — E119 Type 2 diabetes mellitus without complications: Secondary | ICD-10-CM | POA: Insufficient documentation

## 2017-09-06 DIAGNOSIS — M25571 Pain in right ankle and joints of right foot: Secondary | ICD-10-CM | POA: Insufficient documentation

## 2017-09-06 DIAGNOSIS — N183 Chronic kidney disease, stage 3 (moderate): Secondary | ICD-10-CM | POA: Diagnosis not present

## 2017-09-06 DIAGNOSIS — E1122 Type 2 diabetes mellitus with diabetic chronic kidney disease: Secondary | ICD-10-CM | POA: Diagnosis not present

## 2017-09-06 DIAGNOSIS — Z7901 Long term (current) use of anticoagulants: Secondary | ICD-10-CM | POA: Insufficient documentation

## 2017-09-06 DIAGNOSIS — I82509 Chronic embolism and thrombosis of unspecified deep veins of unspecified lower extremity: Secondary | ICD-10-CM | POA: Diagnosis not present

## 2017-09-06 DIAGNOSIS — S99911A Unspecified injury of right ankle, initial encounter: Secondary | ICD-10-CM | POA: Diagnosis not present

## 2017-09-06 NOTE — Discharge Instructions (Signed)
X-rays were negative. Wear your ankle brace for the next 1 week to help with compression, stability, pain. Rest, elevate, ice. Tylenol or ibuprofen for pain. Follow-up with primary care doctor in 1 week if pain persists.

## 2017-09-06 NOTE — ED Notes (Signed)
ASO ankle brace applied to right ankle

## 2017-09-06 NOTE — ED Notes (Signed)
Patient transported to X-ray 

## 2017-09-06 NOTE — ED Provider Notes (Signed)
[facility] EMERGENCY DEPARTMENT Provider Note   CSN: [id number] Arrival date & time: 09/06/17  8264     History   Chief Complaint Chief Complaint  Patient presents with  . Foot Pain    HPI Katherine Orr is a 72 y.o. female w/ h/o DM, HTN, here for evaluation of right foot pain after fall 1 week ago, patient was walking on grass when she rolled her foot inward and fell forward. There was no head trauma, LOC or prodromal symptoms. She endorses pain to right ankle and right toe, mild swelling, pain with weight bearing. Son at bedside translating, she declined Optometrist.   No fevers, chills, warmth or redness to ankle, h/o gout. No previous injury to ankle/foot.   HPI  Past Medical History:  Diagnosis Date  . Diabetes mellitus without complication ([place])   . Environmental allergies    perfumes, seasonal  . Headache(784.0)   . Hypertension     Patient Active Problem List   Diagnosis Date Noted  . New onset of headaches 08/22/2013    Past Surgical History:  Procedure Laterality Date  . ARTERY BIOPSY Left 08/28/2013   Procedure: BIOPSY TEMPORAL ARTERY- LEFT;  Surgeon: Elam Dutch, MD;  Location: [facility];  Service: Vascular;  Laterality: Left;  . HERNIA REPAIR       OB History   None      Home Medications    Prior to Admission medications   Medication Sig Start Date End Date Taking? Authorizing Provider  glipiZIDE (GLUCOTROL) 5 MG tablet Take 5 mg by mouth 2 (two) times daily before a meal.    [provider]  latanoprost (XALATAN) 0.005 % ophthalmic solution Place 1 drop into both eyes at bedtime.    [provider]  losartan (COZAAR) 50 MG tablet Take 50 mg by mouth daily.    [provider]  metFORMIN (GLUCOPHAGE) 500 MG tablet Take 500 mg by mouth 2 (two) times daily with a meal.    [provider]  rivaroxaban (XARELTO) KIT 1 kit by Does not apply route once. 09/02/15   Drenda Freeze, MD  XARELTO  STARTER PACK 15 & 20 MG TBPK Take 15-20 mg by mouth as directed. Take as directed on package: Start with one 46m tablet by mouth twice a day with food. On Day 22, switch to one 260mtablet once a day with food. 09/02/15   YaDrenda FreezeMD    Family History No family history on file.  Social History Social History   Tobacco Use  . Smoking status: Never Smoker  . Smokeless tobacco: Never Used  Substance Use Topics  . Alcohol use: No  . Drug use: No     Allergies   Onion   Review of Systems Review of Systems  Musculoskeletal: Positive for arthralgias, gait problem and joint swelling.  All other systems reviewed and are negative.    Physical Exam Updated Vital Signs BP (!) 143/74 (BP Location: Right Arm)   Pulse 75   Temp 98.2 F (36.8 C) (Oral)   Resp 16   SpO2 97%   Physical Exam  Constitutional: She is oriented to person, place, and time. She appears well-developed and well-nourished.  Non-toxic appearance.  HENT:  Head: Normocephalic.  Right Ear: External ear normal.  Left Ear: External ear normal.  Nose: Nose normal.  Eyes: Conjunctivae and EOM are normal.  Neck: Full passive range of motion without pain.  Cardiovascular: Normal rate.  2+  DP and PT pulses bilaterally. No calf edema or tenderness.   Pulmonary/Chest: Effort normal. No tachypnea. No respiratory distress.  Musculoskeletal: Normal range of motion. She exhibits edema and tenderness.  Tenderness, edema to bilateral malleoli. Focal tenderness along first metatarsal, MTP and big toe. Able to wiggle all toes, full AROM of ankle with mild pain with inversion. Positive talar test. Achilles tendon is non tender. Negative anterior and posterior drawer. Negative syndesmosis squeeze test. Negative Thompson test.   Neurological: She is alert and oriented to person, place, and time.  Sensation to light touch grossly intact to bilateral feet. 5/5 strength with ankle flexion/extension.   Skin: Skin is warm  and dry. Capillary refill takes less than 2 seconds.  Psychiatric: Her behavior is normal. Thought content normal.     ED Treatments / Results  Labs (all labs ordered are listed, but only abnormal results are displayed) Labs Reviewed - No data to display  EKG None  Radiology Dg Foot Complete Right  Result Date: 09/06/2017 CLINICAL DATA:  Twisting injury 1 week ago. Right ankle pain. Right toe pain. EXAM: RIGHT FOOT COMPLETE - 3+ VIEW COMPARISON:  None. FINDINGS: No acute bony abnormality. Specifically, no fracture, subluxation, or dislocation. Joint spaces maintained. IMPRESSION: No acute bony abnormality. Electronically Signed   By: Rolm Baptise M.D.   On: 09/06/2017 10:56    Procedures Procedures (including critical care time)  Medications Ordered in ED Medications - No data to display   Initial Impression / Assessment and Plan / ED Course  I have reviewed the triage vital signs and the nursing notes.  Pertinent labs & imaging results that were available during my care of the patient were reviewed by me and considered in my medical decision making (see chart for details).    X-ray negative. Likely soft tissue injury. No significant laxity to the joint, extremities neurovascularly intact. There was no head trauma, prodromal symptoms, this sounds like a mechanical fall. We'll discharge with brace, conservative management.   Final Clinical Impressions(s) / ED Diagnoses   Final diagnoses:  Acute right ankle pain    ED Discharge Orders    None       Kinnie Feil, PA-C 09/06/17 [place], DO 09/06/17 1523

## 2017-09-06 NOTE — Discharge Planning (Signed)
Katherine Cohnamellia Shar Paez, RN, BSN, [place] [phone] Pt qualifies for DME cane.  [facility]  [facility] to pt room prior to D/C home.

## 2017-09-06 NOTE — ED Triage Notes (Signed)
Pt speaks arabic, here with son. 1 week ago was walking in the grass, twisted her right ankle. C/o right ankle pain, right toe pain. Minimal swelling, able to wear some weight. Denies other problems or concerns.

## 2017-09-07 DIAGNOSIS — N183 Chronic kidney disease, stage 3 (moderate): Secondary | ICD-10-CM | POA: Diagnosis not present

## 2017-09-07 DIAGNOSIS — I82509 Chronic embolism and thrombosis of unspecified deep veins of unspecified lower extremity: Secondary | ICD-10-CM | POA: Diagnosis not present

## 2017-09-07 DIAGNOSIS — E1122 Type 2 diabetes mellitus with diabetic chronic kidney disease: Secondary | ICD-10-CM | POA: Diagnosis not present

## 2017-09-07 DIAGNOSIS — I10 Essential (primary) hypertension: Secondary | ICD-10-CM | POA: Diagnosis not present

## 2017-10-01 DIAGNOSIS — H52201 Unspecified astigmatism, right eye: Secondary | ICD-10-CM | POA: Diagnosis not present

## 2017-10-01 DIAGNOSIS — H401134 Primary open-angle glaucoma, bilateral, indeterminate stage: Secondary | ICD-10-CM | POA: Diagnosis not present

## 2017-10-01 DIAGNOSIS — E119 Type 2 diabetes mellitus without complications: Secondary | ICD-10-CM | POA: Diagnosis not present

## 2017-10-01 DIAGNOSIS — Z961 Presence of intraocular lens: Secondary | ICD-10-CM | POA: Diagnosis not present

## 2017-11-08 DIAGNOSIS — F321 Major depressive disorder, single episode, moderate: Secondary | ICD-10-CM | POA: Diagnosis not present

## 2017-11-08 DIAGNOSIS — F411 Generalized anxiety disorder: Secondary | ICD-10-CM | POA: Diagnosis not present

## 2018-07-21 IMAGING — US US EXTREM LOW VENOUS*L*
1 series · 14 of 24 positions shown · non-contrast
Comparison: 11/05/2015

CLINICAL DATA: Left femoral-popliteal DVT

EXAM:
LEFT LOWER EXTREMITY VENOUS DOPPLER ULTRASOUND
TECHNIQUE: Gray-scale sonography with compression, as well as color and duplex
ultrasound, were performed to evaluate the deep venous system from
the level of the common femoral vein through the popliteal and
proximal calf veins.

[Series 1: us extrem low venous*left* · 14 of 37 slices shown]
[im 1/37]
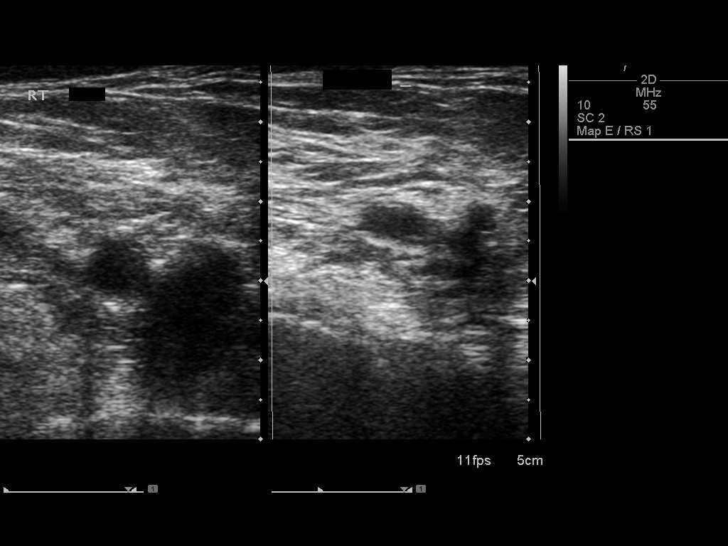
[im 4/37]
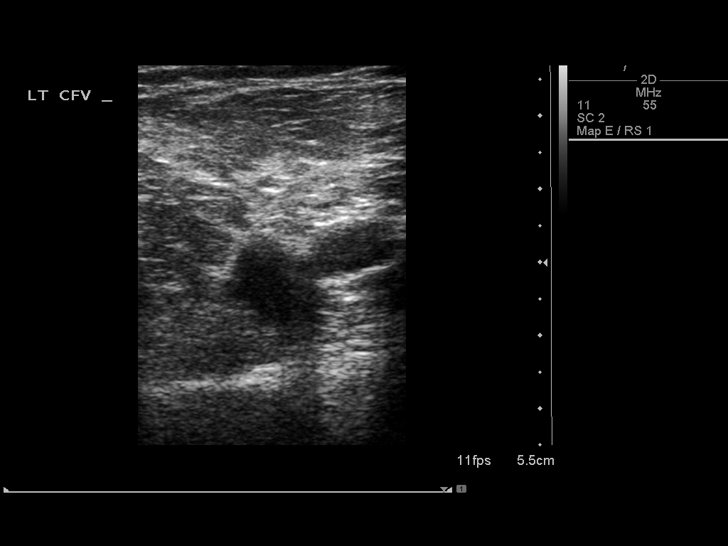
[im 7/37]
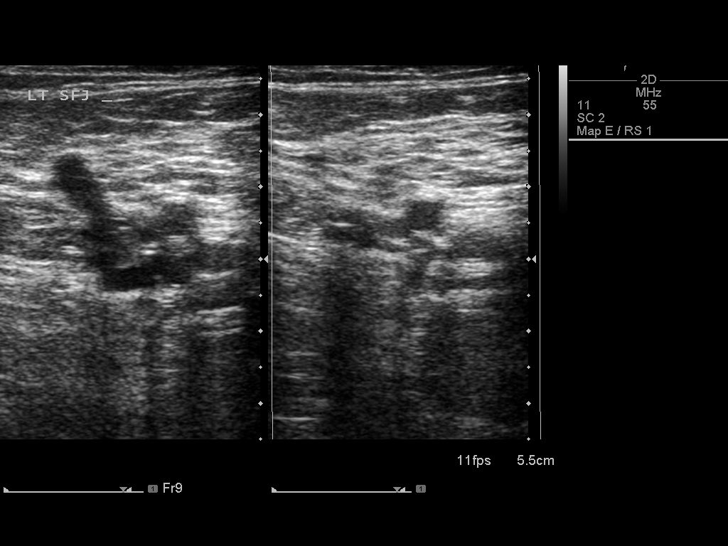
[im 10/37]
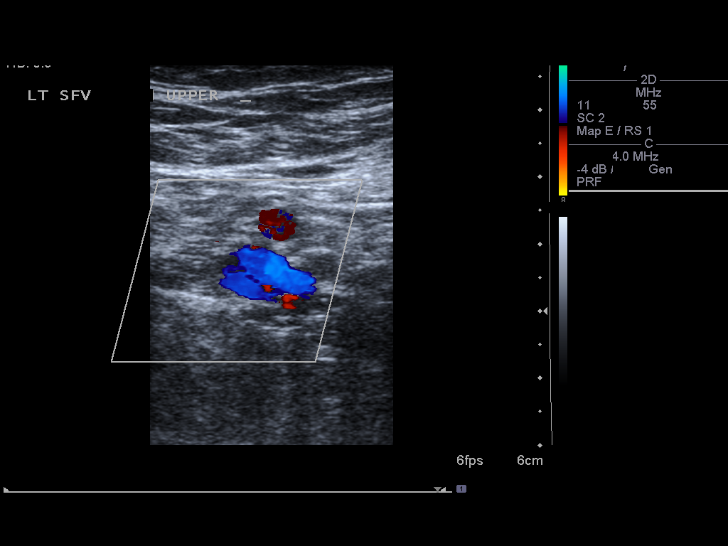
[im 11/37]
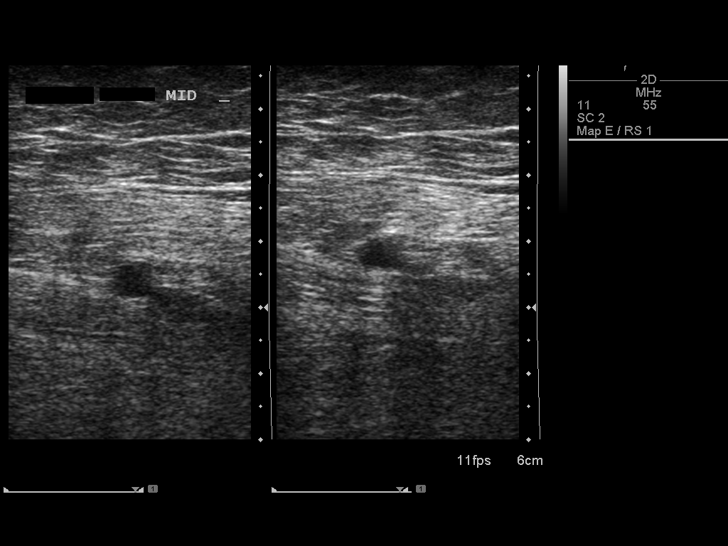
[im 15/37]
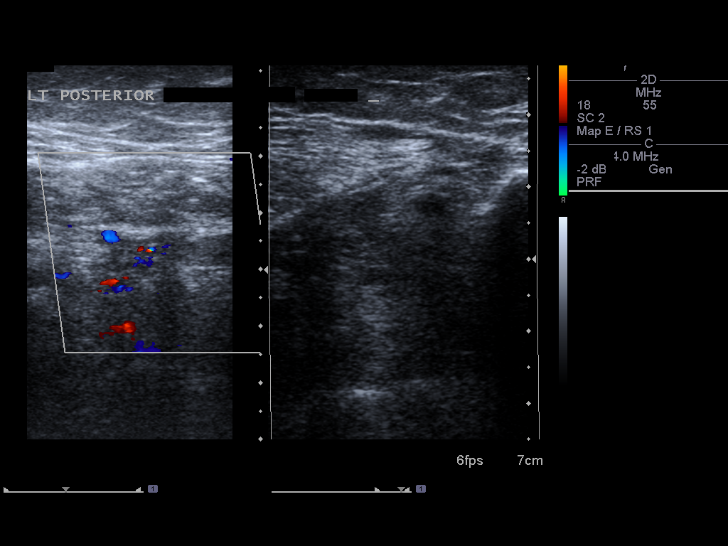
[im 18/37]
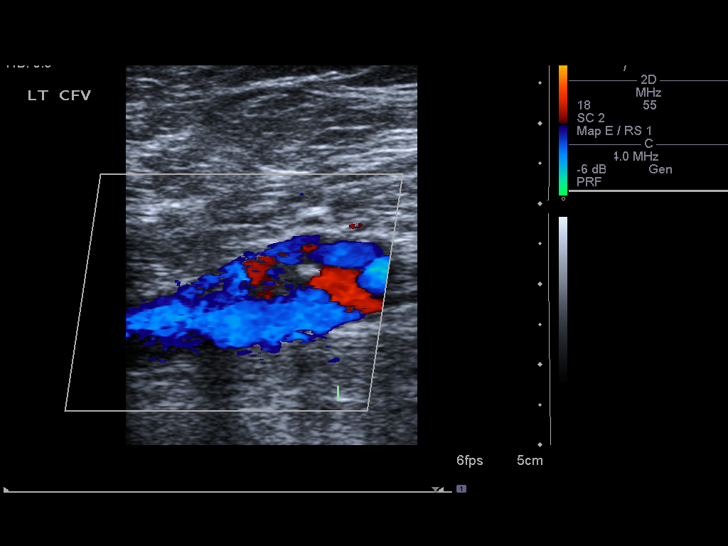
[im 19/37]
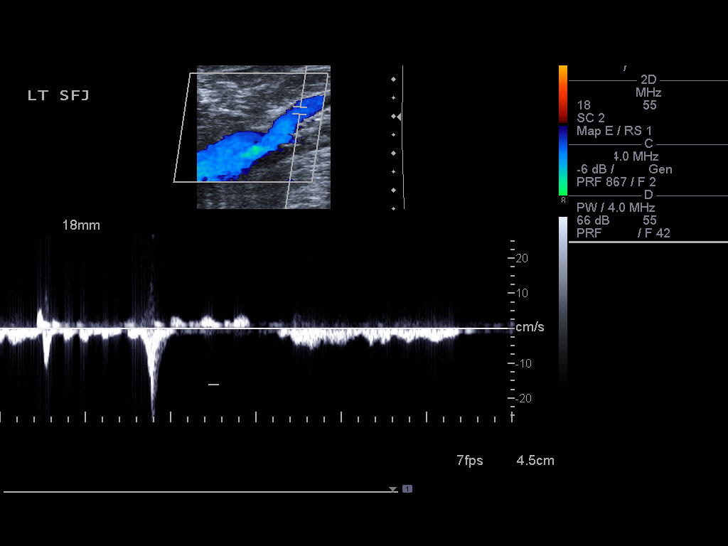
[im 22/37]
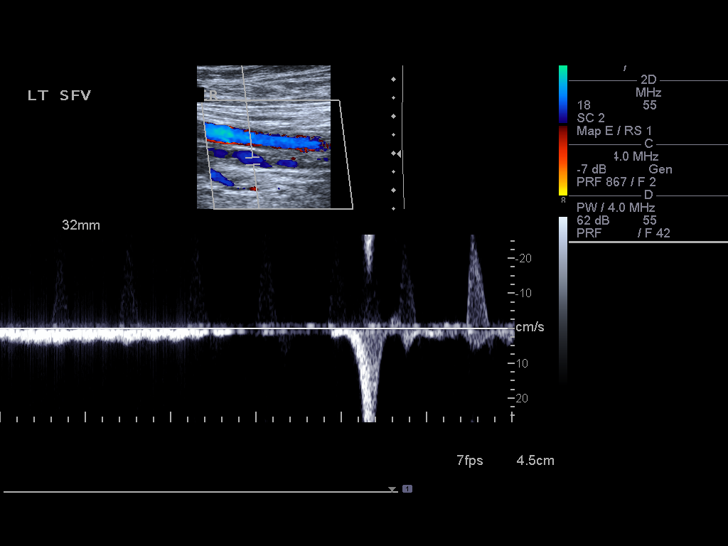
[im 26/37]
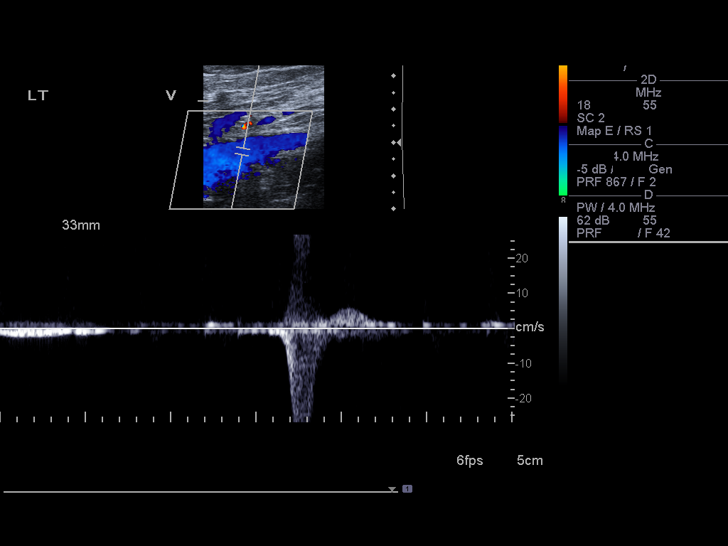
[im 29/37]
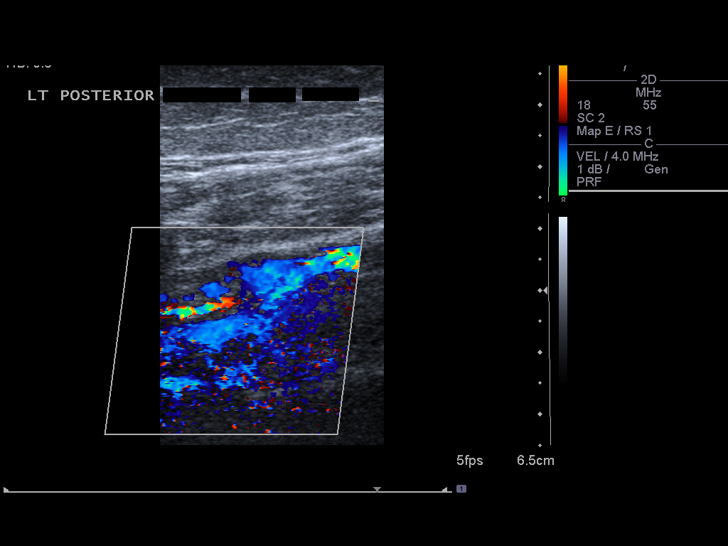
[im 30/37]
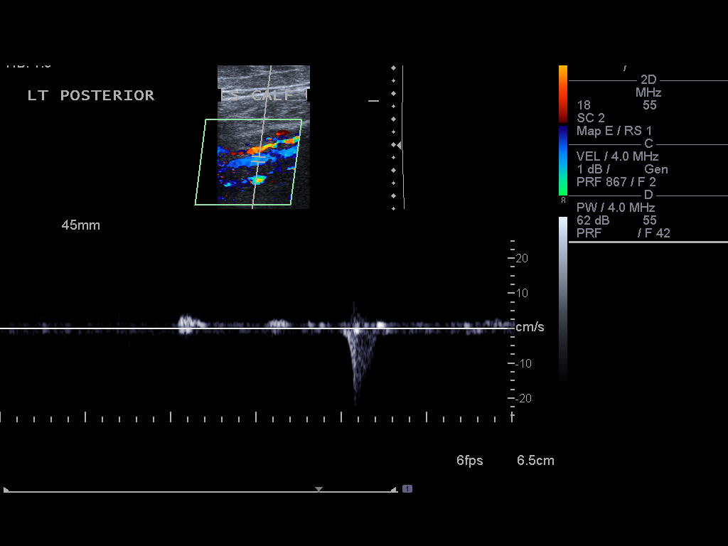
[im 33/37]
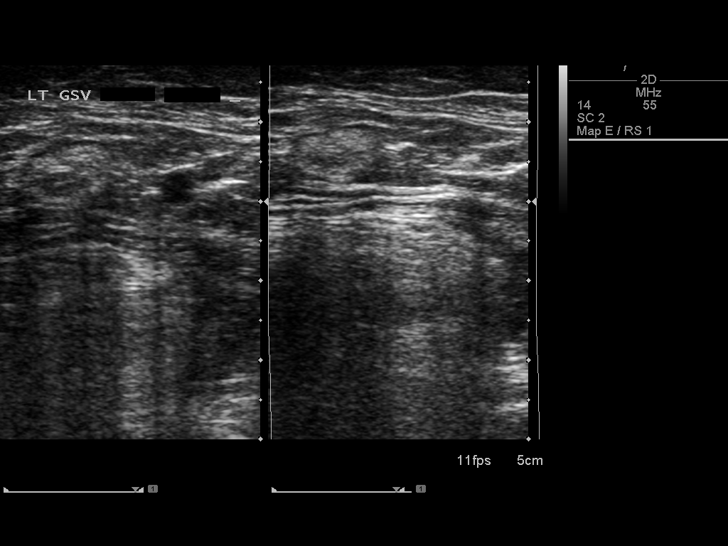
[im 37/37]
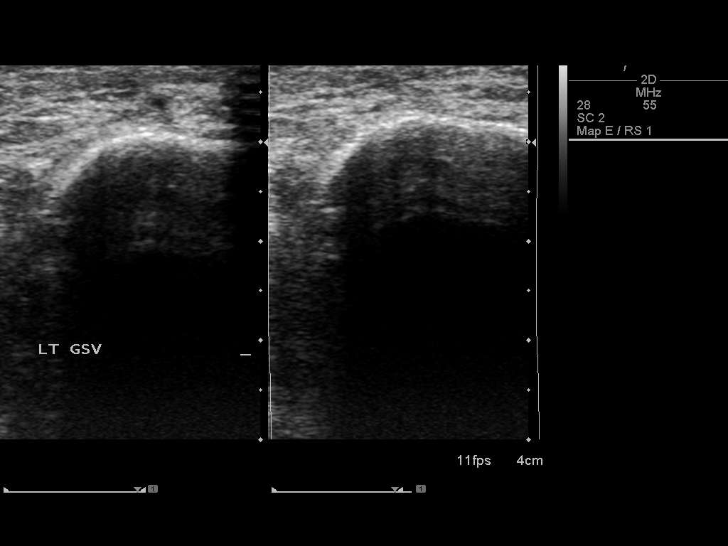

[14 of 24 positions shown; findings below may reference images not displayed]

FINDINGS: Incompletely compressible left common femoral vein, saphenofemoral
junction, proximal femoral vein, and popliteal vein. Color flow
signal it is noted through the segments indicating no completely
occlusive thrombus. Visualized calf veins unremarkable. Visualized
segments of the saphenous venous system normal in caliber and
compressibility. Survey views of the contralateral common femoral
vein are unremarkable.
IMPRESSION: 1. Residual partially occlusive chronic DVT in the popliteal,
femoral, and common femoral veins.
2. No evidence of new or acute DVT since previous exam .

## 2020-01-08 IMAGING — DX DG FOOT COMPLETE 3+V*R*
3 series · 3 of 3 positions shown · non-contrast
Comparison: None.

CLINICAL DATA: Twisting injury 1 week ago. Right ankle pain. Right
toe pain.

EXAM:
RIGHT FOOT COMPLETE - 3+ VIEW

[foot ap]
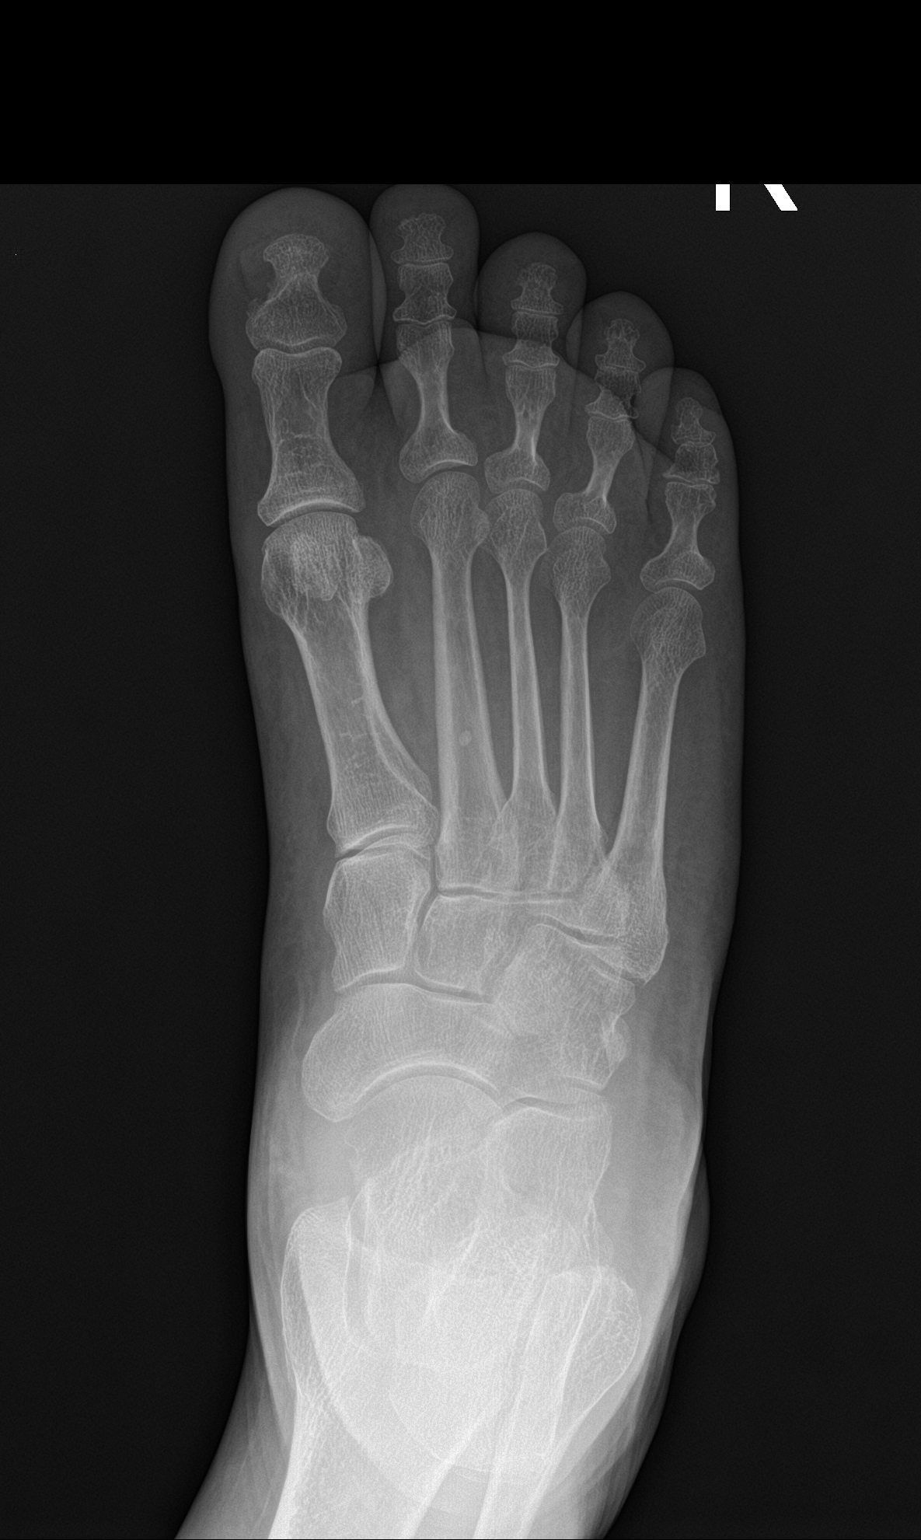

[foot obl]
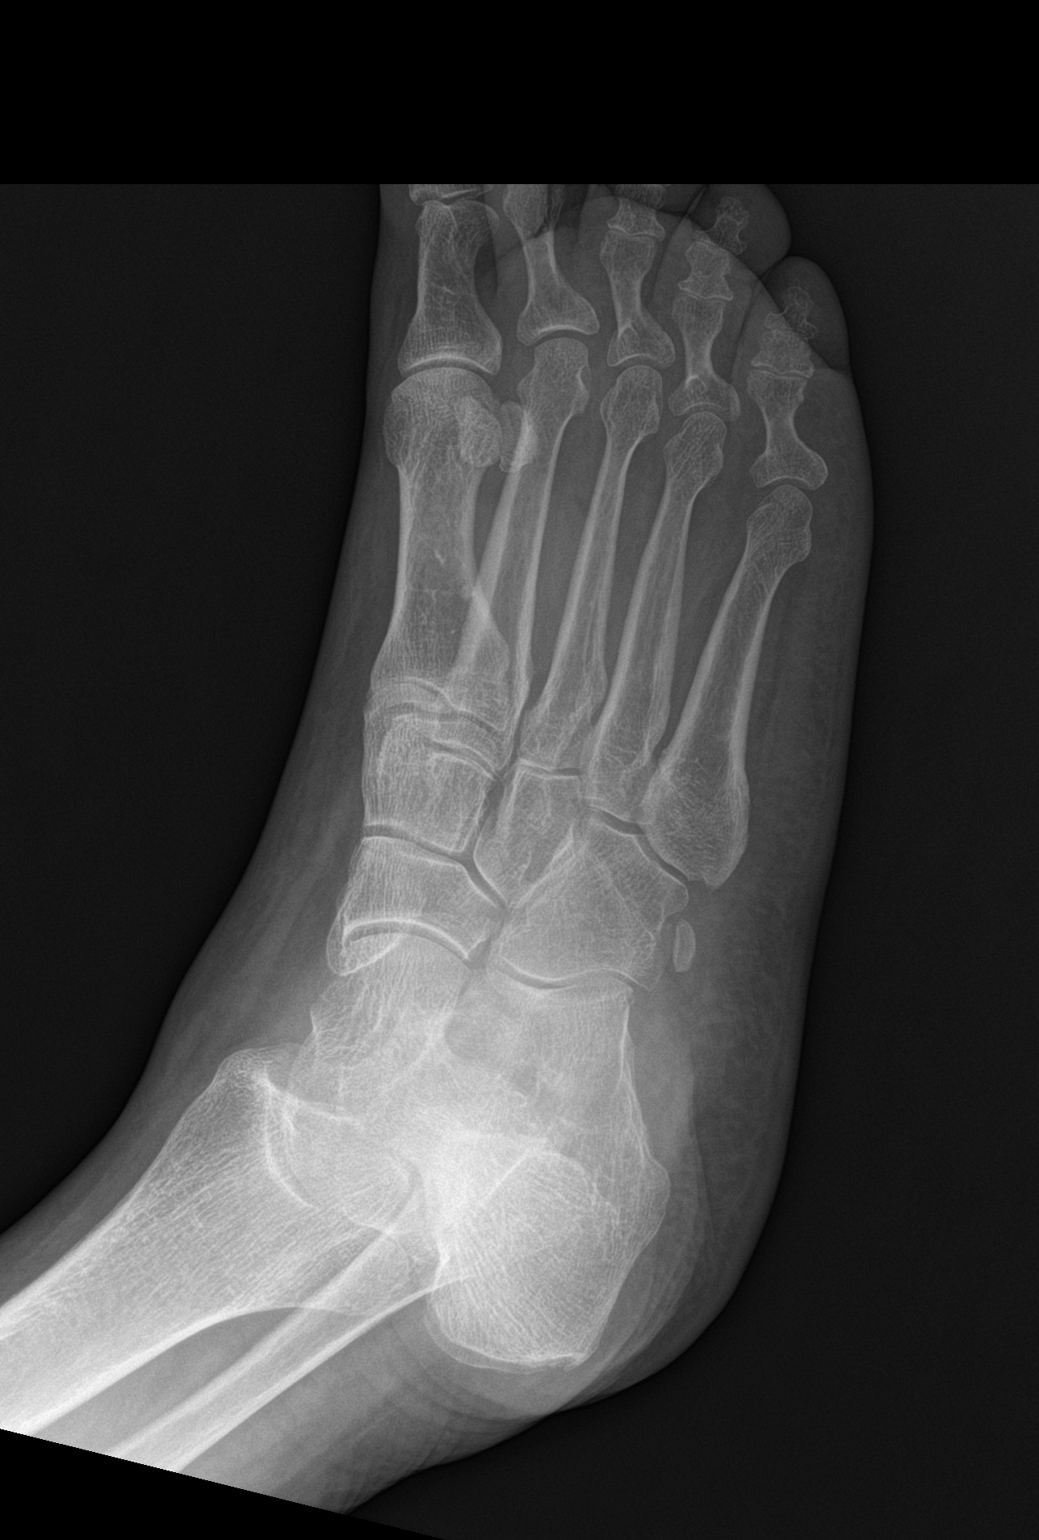

[foot lat]
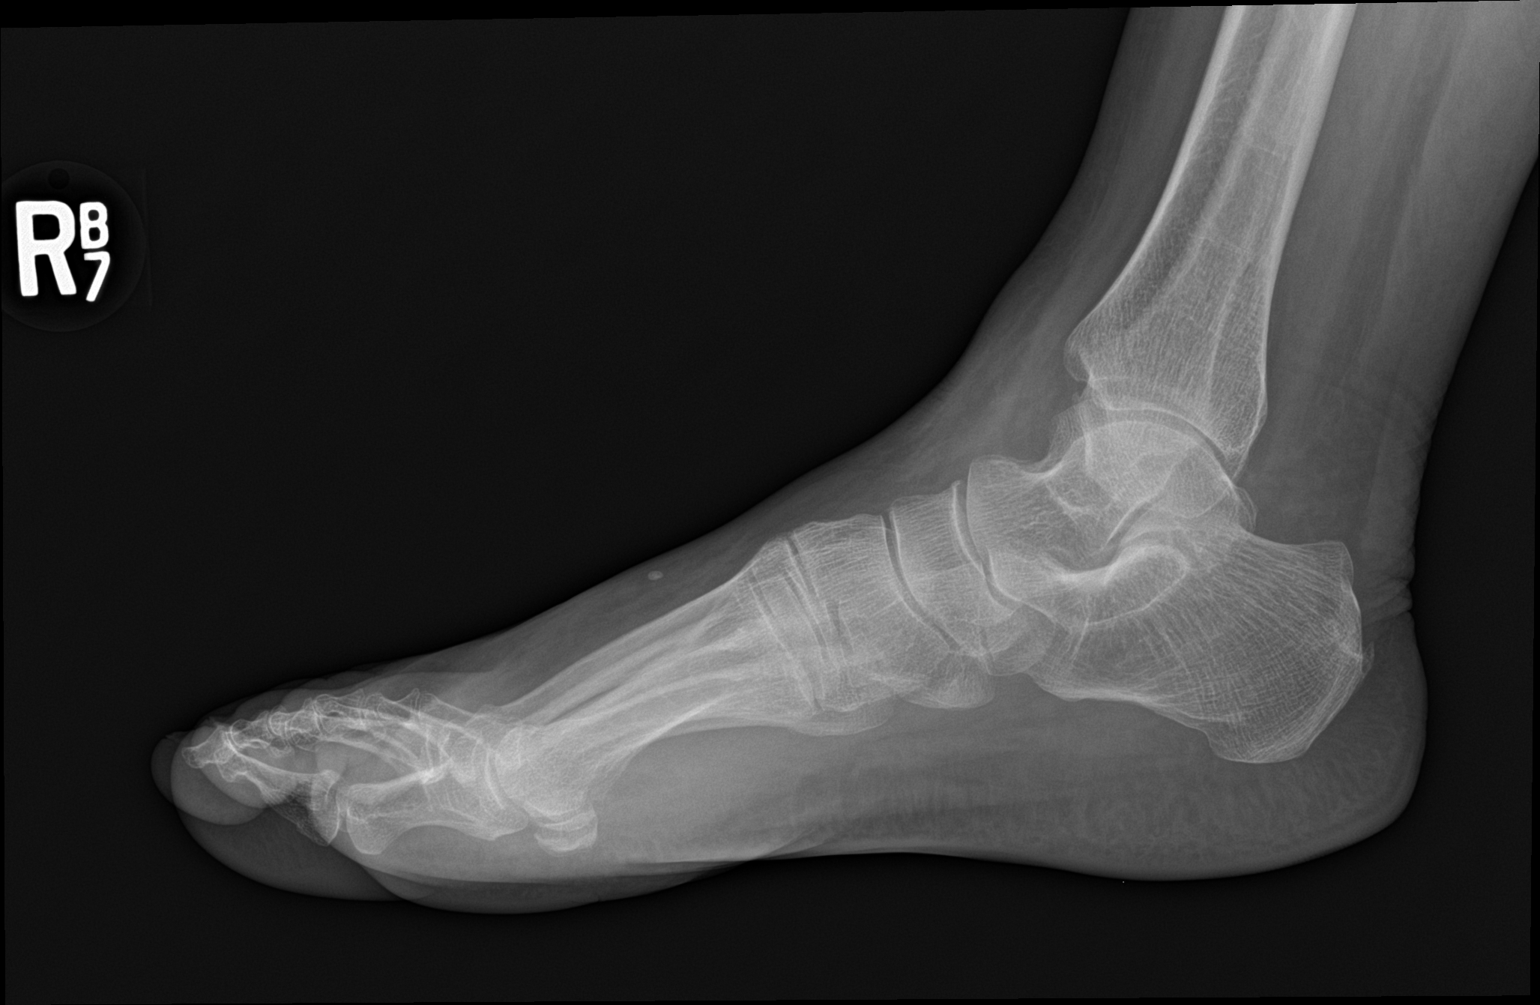

[3 of 3 positions shown; findings below may reference images not displayed]

FINDINGS: No acute bony abnormality. Specifically, no fracture, subluxation,
or dislocation. Joint spaces maintained.
IMPRESSION: No acute bony abnormality.
